# Patient Record
Sex: Female | Born: 1965
Health system: Southern US, Community
[De-identification: ages and names within clinical notes are randomized; demographics above are authoritative.]

## PROBLEM LIST (undated history)

## (undated) DIAGNOSIS — I82409 Acute embolism and thrombosis of unspecified deep veins of unspecified lower extremity: Secondary | ICD-10-CM

## (undated) HISTORY — PX: JOINT REPLACEMENT: SHX530

---

## 2000-04-04 ENCOUNTER — Inpatient Hospital Stay (HOSPITAL_COMMUNITY): Admission: AD | Admit: 2000-04-04 | Discharge: 2000-04-07 | Payer: Self-pay | Admitting: Obstetrics and Gynecology

## 2001-10-25 ENCOUNTER — Other Ambulatory Visit: Admission: RE | Admit: 2001-10-25 | Discharge: 2001-10-25 | Payer: Self-pay | Admitting: Obstetrics and Gynecology

## 2002-08-23 ENCOUNTER — Encounter: Admission: RE | Admit: 2002-08-23 | Discharge: 2002-08-23 | Payer: Self-pay | Admitting: *Deleted

## 2002-08-23 ENCOUNTER — Encounter: Payer: Self-pay | Admitting: *Deleted

## 2002-09-15 ENCOUNTER — Encounter: Admission: RE | Admit: 2002-09-15 | Discharge: 2002-09-15 | Payer: Self-pay | Admitting: Orthopedic Surgery

## 2002-09-15 ENCOUNTER — Encounter: Payer: Self-pay | Admitting: Orthopedic Surgery

## 2002-10-31 ENCOUNTER — Other Ambulatory Visit: Admission: RE | Admit: 2002-10-31 | Discharge: 2002-10-31 | Payer: Self-pay | Admitting: Obstetrics and Gynecology

## 2003-02-13 ENCOUNTER — Encounter: Admission: RE | Admit: 2003-02-13 | Discharge: 2003-02-13 | Payer: Self-pay | Admitting: *Deleted

## 2003-02-13 ENCOUNTER — Encounter: Payer: Self-pay | Admitting: *Deleted

## 2003-06-07 ENCOUNTER — Ambulatory Visit (HOSPITAL_BASED_OUTPATIENT_CLINIC_OR_DEPARTMENT_OTHER): Admission: RE | Admit: 2003-06-07 | Discharge: 2003-06-07 | Payer: Self-pay | Admitting: Orthopedic Surgery

## 2004-02-22 ENCOUNTER — Other Ambulatory Visit: Admission: RE | Admit: 2004-02-22 | Discharge: 2004-02-22 | Payer: Self-pay | Admitting: Obstetrics and Gynecology

## 2004-05-09 ENCOUNTER — Ambulatory Visit (HOSPITAL_COMMUNITY): Admission: RE | Admit: 2004-05-09 | Discharge: 2004-05-09 | Payer: Self-pay | Admitting: Orthopedic Surgery

## 2004-10-19 ENCOUNTER — Inpatient Hospital Stay (HOSPITAL_COMMUNITY): Admission: AD | Admit: 2004-10-19 | Discharge: 2004-10-19 | Payer: Self-pay | Admitting: Obstetrics and Gynecology

## 2004-10-22 ENCOUNTER — Inpatient Hospital Stay (HOSPITAL_COMMUNITY): Admission: AD | Admit: 2004-10-22 | Discharge: 2004-10-22 | Payer: Self-pay | Admitting: Obstetrics and Gynecology

## 2004-10-23 ENCOUNTER — Inpatient Hospital Stay (HOSPITAL_COMMUNITY): Admission: AD | Admit: 2004-10-23 | Discharge: 2004-10-23 | Payer: Self-pay | Admitting: Obstetrics and Gynecology

## 2004-10-25 ENCOUNTER — Inpatient Hospital Stay (HOSPITAL_COMMUNITY): Admission: AD | Admit: 2004-10-25 | Discharge: 2004-10-25 | Payer: Self-pay | Admitting: Obstetrics and Gynecology

## 2005-06-08 ENCOUNTER — Other Ambulatory Visit: Admission: RE | Admit: 2005-06-08 | Discharge: 2005-06-08 | Payer: Self-pay | Admitting: Obstetrics and Gynecology

## 2006-05-31 ENCOUNTER — Encounter: Admission: RE | Admit: 2006-05-31 | Discharge: 2006-05-31 | Payer: Self-pay | Admitting: Family Medicine

## 2006-07-05 ENCOUNTER — Other Ambulatory Visit: Admission: RE | Admit: 2006-07-05 | Discharge: 2006-07-05 | Payer: Self-pay | Admitting: Obstetrics and Gynecology

## 2007-04-21 ENCOUNTER — Encounter: Admission: RE | Admit: 2007-04-21 | Discharge: 2007-04-21 | Payer: Self-pay | Admitting: Orthopedic Surgery

## 2007-08-22 ENCOUNTER — Encounter: Admission: RE | Admit: 2007-08-22 | Discharge: 2007-08-22 | Payer: Self-pay | Admitting: Obstetrics and Gynecology

## 2008-08-22 ENCOUNTER — Encounter: Admission: RE | Admit: 2008-08-22 | Discharge: 2008-08-22 | Payer: Self-pay | Admitting: Obstetrics and Gynecology

## 2009-08-23 ENCOUNTER — Encounter: Admission: RE | Admit: 2009-08-23 | Discharge: 2009-08-23 | Payer: Self-pay | Admitting: Obstetrics and Gynecology

## 2010-08-25 ENCOUNTER — Encounter
Admission: RE | Admit: 2010-08-25 | Discharge: 2010-08-25 | Payer: Self-pay | Source: Home / Self Care | Attending: Obstetrics and Gynecology | Admitting: Obstetrics and Gynecology

## 2010-09-07 ENCOUNTER — Encounter: Payer: Self-pay | Admitting: Obstetrics and Gynecology

## 2011-01-02 NOTE — Op Note (Signed)
NAME:  Jordan Fitzgerald, Jordan Fitzgerald                    ACCOUNT NO.:  [id number]   MEDICAL RECORD NO.:  [id number]                   PATIENT TYPE:  AMB   LOCATION:  DSC                                  FACILITY:  [facility]   PHYSICIAN:  Katy Fitch. Naaman Plummer., M.D.          DATE OF BIRTH:  [birth year]   DATE OF PROCEDURE:  06/07/2003  DATE OF DISCHARGE:                                 OPERATIVE REPORT   PREOPERATIVE DIAGNOSES:  Chronic pain, left wrist, consistent with de  Quervain's stenosing tenosynovitis, and possible early scaphotrapezial  trapezoid arthritis/early ganglion formation versus synovitis.   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   PROCEDURE:  1. Exploration and release of left first dorsal compartment.  2. Exploration of capsule of the left scaphotrapezial trapezoid joint, with     identification of no significant gross pathology.   SURGEON:  Katy Fitch. Sypher, M.D.   ASSISTANT:  Marveen Reeks. Dasnoit, P.A.-C.   ANESTHESIA:  General by LMA.   ANESTHESIOLOGIST:  Bedelia Person, M.D.   INDICATIONS FOR PROCEDURE:  The patient is a 45 year old, right hand-  dominant Scientist, water quality, employed by [employer], [employer].  For the past 11  months she has experienced a shooting pain at the base of her left thumb.  She has been evaluated by Dr. Lyndee Leo. Janey Greaser and Dr. Vianne Bulls of  [facility].  Dr. Janey Greaser had initially noted a  probable first dorsal compartment stenosing tenosynovitis, and had very  appropriately injected her and placed her in a thumb Spica splint.  With  persistent pain in January 2004, he ordered an MRI of the left wrist.  This  was interpreted by Dr. Rolan Bucco L. Dover, attending radiologist, and revealed  no evidence of a fracture or avascular necrosis.  No sign of ligament  injury.  A  small amount of fluid within the scaphotrapezial articulation.  Dr. Kearney Hard felt that there may be synovitis present at the scaphotrapezial  articulation.   Due to a failure to respond to anti-inflammatory medication and prolonged  splitting, Dr. Janey Greaser referred the patient back for a radiologic-guided  injection of the left scaphotrapezial joint that was accomplished by Dr.  Dallie Piles on February 13, 2003.  The patient was not relieved of pain,  therefore after a persistent discomfort through the summer of 2004, she was  referred for an upper extremity orthopedic consultation on May 14, 2003.  A clinical examination at that time revealed clinical first dorsal  compartment stenosing tenosynovitis with a marked positive Finkelstein's  sign.  She was noted to have normal plain films, and a review of her MRI  dated January  2004, did indeed reveal fluid within the STT joint.  We  recommended proceeding with exploration and release of the first dorsal  compartment, as well as exploration of the  capsule of the ST-T joint.  I  could not palpate a ganglion or other mass; however, based on the MRI  evidence, it appeared that she may have some degree of synovitis at the  scaphotrapezial trapezoid joint.   After an informed consent, she is brought to the operating room at this  time.  During the preoperative interview, we once again confirmed that she  had a positive first dorsal compartment stenosing tenosynovitis with a  positive Finkelstein's sign, and could again not palpate a ganglion.   DESCRIPTION OF PROCEDURE:  The patient is brought to the operating room and  placed in the supine position upon the operating room table.  Following the  induction of general anesthesia by LMA, the left arm was prepped with  Betadine soap and solution, and sterilely draped.  Following exsanguination  of the left arm with an Esmarch bandage, an arterial tourniquet on the  proximal brachium was inflated  to 220 mmHg.  The procedure commenced with a  transverse incision over the distal aspect of the first dorsal compartment.  The subcutaneous tissues were carefully divided, taking care to identify the  cephalic vein and the radial sensory branches.  Blunt retractors were gently  placed, exposing the entire compartment, followed by a release of the  compartment with a scalpel and scissors.  Three tendons were present.  A  septum was not present.  There was a small extensor pollicis brevis noted  dorsally, and two slips of the abductor pollicis longus.  Upon release of  the compartment, free glide of the tendons was accomplished.  The tendons  were followed distally to the base of the thumb metacarpal, confirming the  insertions of the abductor pollicis longus tendons.  With great care, the  environs of the scaphotrapezial trapezoid joint were explored.  With gentle  blunt dissection utilizing tenotomy scissors, the radial artery was followed  from its pathway volar to the first dorsal compartment, deep to the  compartment, deep to the tendons, to the dorsal aspect of the  scaphotrapezial trapezoid joint.  Numerous branches were followed towards  the scaphoid, and no signs of a ganglion were noted.  There was no sign of a  diverticulum from the joint, nor significant synovitis extruding from the  joint.   After careful palpation of the thumb CMC joint, including stability testing  which was noted to be normal, and careful palpation of the environs of the  scaphotrapezial trapezoid joint, and examination of the scapholunate joint,  I could not identify significant pathology.  The wound was then irrigated.  The tendons were placed in the first dorsal compartment, and the skin was  repaired with intradermal #3-0 Prolene and a Steri-Strip.  A compressive  dressing was applied with sterile gauze and Ace wrap.  The patient has been advised that she can remove her dressing in three to  four days.   She may apply either a Band-Aid or simple Ace wrap and gauze  dressing.  She will begin immediate range of motion exercises.  There were  no apparent complications.                                               Katy Fitch Naaman Plummer., M.D.    RVS/MEDQ  D:  06/07/2003  T:  06/07/2003  Job:  [id number]   cc:   C.  Duane Lope, M.D.  [address]  [place]  [place] [address]  Fax: [phone]   Lyndee Leo. Janey Greaser, M.D.  [facility]   Anesthesia Department -  [facility]

## 2011-02-13 ENCOUNTER — Other Ambulatory Visit: Payer: Self-pay | Admitting: Family Medicine

## 2011-02-13 ENCOUNTER — Ambulatory Visit
Admission: RE | Admit: 2011-02-13 | Discharge: 2011-02-13 | Disposition: A | Discharge status: Home / Self Care | Payer: 59 | Source: Ambulatory Visit | Attending: Family Medicine | Admitting: Family Medicine

## 2011-02-13 DIAGNOSIS — R609 Edema, unspecified: Secondary | ICD-10-CM

## 2011-02-13 DIAGNOSIS — R52 Pain, unspecified: Secondary | ICD-10-CM

## 2011-07-03 ENCOUNTER — Other Ambulatory Visit: Payer: Self-pay | Admitting: Family Medicine

## 2011-07-03 DIAGNOSIS — I82409 Acute embolism and thrombosis of unspecified deep veins of unspecified lower extremity: Secondary | ICD-10-CM

## 2011-08-03 ENCOUNTER — Ambulatory Visit
Admission: RE | Admit: 2011-08-03 | Discharge: 2011-08-03 | Disposition: A | Discharge status: Home / Self Care | Payer: 59 | Source: Ambulatory Visit | Attending: Family Medicine | Admitting: Family Medicine

## 2011-08-03 DIAGNOSIS — I82409 Acute embolism and thrombosis of unspecified deep veins of unspecified lower extremity: Secondary | ICD-10-CM

## 2011-10-30 ENCOUNTER — Other Ambulatory Visit: Payer: Self-pay | Admitting: Obstetrics and Gynecology

## 2011-10-30 ENCOUNTER — Ambulatory Visit (HOSPITAL_COMMUNITY)
Admission: RE | Admit: 2011-10-30 | Discharge: 2011-10-30 | Disposition: A | Discharge status: Home / Self Care | Payer: 59 | Source: Ambulatory Visit | Attending: Family Medicine | Admitting: Family Medicine

## 2011-10-30 DIAGNOSIS — M7989 Other specified soft tissue disorders: Secondary | ICD-10-CM | POA: Insufficient documentation

## 2011-10-30 DIAGNOSIS — Z1231 Encounter for screening mammogram for malignant neoplasm of breast: Secondary | ICD-10-CM

## 2011-10-30 DIAGNOSIS — M79609 Pain in unspecified limb: Secondary | ICD-10-CM

## 2011-10-30 DIAGNOSIS — R52 Pain, unspecified: Secondary | ICD-10-CM

## 2011-10-30 NOTE — Progress Notes (Signed)
Right lower extremity venous duplex completed.  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

## 2011-11-24 ENCOUNTER — Ambulatory Visit
Admission: RE | Admit: 2011-11-24 | Discharge: 2011-11-24 | Disposition: A | Discharge status: Home / Self Care | Payer: 59 | Source: Ambulatory Visit | Attending: Obstetrics and Gynecology | Admitting: Obstetrics and Gynecology

## 2011-11-24 DIAGNOSIS — Z1231 Encounter for screening mammogram for malignant neoplasm of breast: Secondary | ICD-10-CM

## 2012-03-18 ENCOUNTER — Encounter: Payer: Self-pay | Admitting: Obstetrics and Gynecology

## 2012-03-18 ENCOUNTER — Ambulatory Visit (INDEPENDENT_AMBULATORY_CARE_PROVIDER_SITE_OTHER): Payer: 59 | Admitting: Obstetrics and Gynecology

## 2012-03-18 VITALS — BP 100/66 | Resp 16 | Ht 71.0 in | Wt 204.0 lb

## 2012-03-18 DIAGNOSIS — I82409 Acute embolism and thrombosis of unspecified deep veins of unspecified lower extremity: Secondary | ICD-10-CM

## 2012-03-18 DIAGNOSIS — Z124 Encounter for screening for malignant neoplasm of cervix: Secondary | ICD-10-CM

## 2012-03-18 DIAGNOSIS — G43909 Migraine, unspecified, not intractable, without status migrainosus: Secondary | ICD-10-CM

## 2012-03-18 DIAGNOSIS — Z0289 Encounter for other administrative examinations: Secondary | ICD-10-CM

## 2012-03-18 DIAGNOSIS — Z0282 Encounter for adoption services: Secondary | ICD-10-CM

## 2012-03-18 MED ORDER — SUMATRIPTAN SUCCINATE 50 MG PO TABS: 50.0000 mg | ORAL_TABLET | Freq: Once | ORAL | Status: DC | PRN

## 2012-03-18 NOTE — Progress Notes (Signed)
Regular Periods: yes Mammogram: yes  Monthly Breast Ex.: no Exercise: no  Tetanus < 10 years: yes Seatbelts: yes  NI. Bladder Functn.: no Abuse at home: no  Daily BM's: yes Stressful Work: yes  Healthy Diet: yes Sigmoid-Colonoscopy: never  Calcium: no Medical problems this year:   Request genetics blood work     LAST PAP: 11/25/10 WNL  Contraception: none   Mammogram:  2013 WNL per pt   PCP: Sigmund Hazel   PMH: 2012 DVT R leg two clogs one in calf one in leg   FMH: Adopted   Last Bone Scan: never

## 2012-03-20 DIAGNOSIS — I82409 Acute embolism and thrombosis of unspecified deep veins of unspecified lower extremity: Secondary | ICD-10-CM | POA: Insufficient documentation

## 2012-03-20 DIAGNOSIS — Z0282 Encounter for adoption services: Secondary | ICD-10-CM | POA: Insufficient documentation

## 2012-03-20 DIAGNOSIS — G43909 Migraine, unspecified, not intractable, without status migrainosus: Secondary | ICD-10-CM | POA: Insufficient documentation

## 2012-03-20 NOTE — Progress Notes (Signed)
Subjective:    Jordan Fitzgerald is a 46 y.o. female, G2P0011, who presents for an annual exam.   Patient reports hx of right leg DVT in 2012.  She is adopted and knows no family hx.  Had genetic testing for thrombophilia after dx of DVT, with negative findings.  Has migraines very sporadically (usually associated with cycle), for which she uses Imitrex--requests refill. Use cycle awareness for contraception.    History   Social History  . Marital Status: Married    Spouse Name: N/A    Number of Children: N/A  . Years of Education: N/A   Social History Main Topics  . Smoking status: Never Smoker   . Smokeless tobacco: Never Used  . Alcohol Use: No  . Drug Use: No  . Sexually Active: Yes -- Female partner(s)   Other Topics Concern  . None   Social History Narrative  . None    Menstrual cycle:   LMP: Patient's last menstrual period was 03/09/2012.           Cycle:Normal, regular  The following portions of the patient's history were reviewed and updated as appropriate: allergies, current medications, past family history, past medical history, past social history, past surgical history and problem list.  Review of Systems Pertinent items are noted in HPI. Breast:Negative for breast lump,nipple discharge or nipple retraction Gastrointestinal: Negative for abdominal pain, change in bowel habits or rectal bleeding Urinary:negative   Objective:    BP 100/66  Resp 16  Ht 5\' 11"  (1.803 m)  Wt 204 lb (92.534 kg)  BMI 28.45 kg/m2  LMP 03/09/2012    Weight:  Wt Readings from Last 1 Encounters:  03/18/12 204 lb (92.534 kg)          BMI: Body mass index is 28.45 kg/(m^2).  General Appearance: Alert, appropriate appearance for age. No acute distress HEENT: Grossly normal Neck / Thyroid: Supple, no masses, nodes or enlargement Lungs: clear to auscultation bilaterally Back: No CVA tenderness Breast Exam: No masses or nodes.No dimpling, nipple retraction or  discharge. Cardiovascular: Regular rate and rhythm. S1, S2, no murmur Gastrointestinal: Soft, non-tender, no masses or organomegaly Pelvic Exam: Vulva and vagina appear normal. Bimanual exam reveals normal uterus and adnexa. Rectovaginal: normal rectal, no masses Lymphatic Exam: Non-palpable nodes in neck, clavicular, axillary, or inguinal regions Skin: no rash or abnormalities Neurologic: Normal gait and speech, no tremor  Psychiatric: Alert and oriented, appropriate affect.   Wet Prep:not applicable Urinalysis:not applicable UPT: Not done   Assessment:    Normal gyn exam  Migraines (occasionally menstrual) Hx right leg DVT 2012   Plan:    Mammogram: 2013, normal Pap:  Done today STD screening: declined Contraception:natural family planning (NFP) Other:  NA Rx Imitrex 50 mg po prn, #30, 2 refills, for migraines sent to patient's pharmacy.      Nyra Capes, [place]

## 2012-03-23 ENCOUNTER — Telehealth: Payer: Self-pay | Admitting: Obstetrics and Gynecology

## 2012-03-23 NOTE — Telephone Encounter (Signed)
Message copied by Mason Jim on Wed Mar 23, 2012 10:28 AM ------      Message from: Cornelius Moras      Created: Fri Mar 18, 2012  4:17 PM      Regarding: BRCA questions       Patient adopted, no FHx known, has questions regarding BRCA testing.  Interested in doing and paying for (since insurance may not cover). Would you please call her next week and give her more information on the test, etc?            Thanks!      VL

## 2012-03-23 NOTE — Telephone Encounter (Signed)
TC to pt. LM to return call.  

## 2012-03-23 NOTE — Telephone Encounter (Signed)
TC from pt.  Returned call. Is interested in BRCA testing. Pt is adopted and does not know family history.  Information relayed per Alma Downs, [facility].  Out of pocket expense for BRCA $3340 plus BART which is recommended $700.  Is recommended by  ACOG so some insurances will cover cost.  Explained if testing sent to lab and would cost pt more than $375, pt would be contacted before testing was done. Pt could also pay on a monthly basis x 25 months. Also informed if test were positive, could resubmit to insurance and may then cover.  Pt has decided to contact insurance company before making decision about testing and call back with status.

## 2012-03-23 NOTE — Telephone Encounter (Signed)
Returned pt's call.  States insurance will cover genetic testing if diagnosis code is correct.  Will need to have provider call [phone] prior to testing for provider to provider call.  Pt has been scheduled 04/27/12 for testing. Note to VL.

## 2012-04-27 ENCOUNTER — Encounter: Payer: Self-pay | Admitting: Obstetrics and Gynecology

## 2012-04-27 ENCOUNTER — Ambulatory Visit (INDEPENDENT_AMBULATORY_CARE_PROVIDER_SITE_OTHER): Payer: 59 | Admitting: Obstetrics and Gynecology

## 2012-04-27 DIAGNOSIS — IMO0002 Reserved for concepts with insufficient information to code with codable children: Secondary | ICD-10-CM

## 2012-04-27 NOTE — Progress Notes (Signed)
Pt presented for genetic counseling for BRCA1 and 2. Pt is adopted and does not know family history. Pt and VL have contacted insurance company regarding coverage. Will investigate further before submitting test specimen. Pt agreeable.

## 2012-04-28 ENCOUNTER — Encounter: Payer: Self-pay | Admitting: Obstetrics and Gynecology

## 2012-04-28 NOTE — Progress Notes (Signed)
Spoke with several persons at [facility] 04/27/12 for a total of 40 min call. Unable to be given specific information regarding diagnosis code to cover genetic testing. Was informed test would need to be submitted as usual by lab and then request for testing would be reviewed.Letter of medical necessity completed by VL and sent with completed forms and specimen. Contacted [employer] for pick-up today.

## 2012-05-04 ENCOUNTER — Telehealth: Payer: Self-pay | Admitting: Obstetrics and Gynecology

## 2012-05-04 NOTE — Telephone Encounter (Signed)
TC from Lavi at [facility] lab [phone] ext [phone]. Questioning genetic component mentioned in letter of medical necessity.  Per Vl informed pt had DVT. Informed pt is requesting testing. Pt does not know family history due to adoption. States will process through insurance and pt will be contacted if cost is .$375.

## 2012-05-11 ENCOUNTER — Telehealth: Payer: Self-pay | Admitting: Obstetrics and Gynecology

## 2012-05-11 MED ORDER — VALACYCLOVIR HCL 1 G PO TABS: ORAL_TABLET | ORAL | Status: DC

## 2012-05-11 NOTE — Telephone Encounter (Signed)
Tc to pt per telephone call. Rx for Valtrex e-pres to pharm on file. Pt agrees.  

## 2012-05-11 NOTE — Telephone Encounter (Signed)
Medication refill

## 2012-05-16 ENCOUNTER — Telehealth: Payer: Self-pay | Admitting: Obstetrics and Gynecology

## 2012-05-16 NOTE — Telephone Encounter (Signed)
TC to pt. Informed that BRCA testing was not approved by insurance. Requesting appeal be implemented.  VL to be informed.

## 2012-05-24 ENCOUNTER — Ambulatory Visit: Payer: 59

## 2012-05-24 ENCOUNTER — Encounter: Payer: 59 | Admitting: Obstetrics and Gynecology

## 2012-06-28 ENCOUNTER — Ambulatory Visit
Admission: RE | Admit: 2012-06-28 | Discharge: 2012-06-28 | Disposition: A | Discharge status: Home / Self Care | Payer: 59 | Source: Ambulatory Visit | Attending: Family Medicine | Admitting: Family Medicine

## 2012-06-28 ENCOUNTER — Other Ambulatory Visit: Payer: Self-pay | Admitting: Family Medicine

## 2012-06-28 DIAGNOSIS — M79659 Pain in unspecified thigh: Secondary | ICD-10-CM

## 2012-10-31 ENCOUNTER — Emergency Department (HOSPITAL_COMMUNITY): Payer: 59

## 2012-10-31 ENCOUNTER — Emergency Department (HOSPITAL_COMMUNITY)
Admission: EM | Admit: 2012-10-31 | Discharge: 2012-10-31 | Disposition: A | Discharge status: Home / Self Care | Payer: 59 | Attending: Emergency Medicine | Admitting: Emergency Medicine

## 2012-10-31 ENCOUNTER — Encounter (HOSPITAL_COMMUNITY): Payer: Self-pay | Admitting: *Deleted

## 2012-10-31 DIAGNOSIS — Z96659 Presence of unspecified artificial knee joint: Secondary | ICD-10-CM | POA: Insufficient documentation

## 2012-10-31 DIAGNOSIS — R1031 Right lower quadrant pain: Secondary | ICD-10-CM | POA: Insufficient documentation

## 2012-10-31 DIAGNOSIS — Z79899 Other long term (current) drug therapy: Secondary | ICD-10-CM | POA: Insufficient documentation

## 2012-10-31 DIAGNOSIS — R112 Nausea with vomiting, unspecified: Secondary | ICD-10-CM | POA: Insufficient documentation

## 2012-10-31 DIAGNOSIS — Z86718 Personal history of other venous thrombosis and embolism: Secondary | ICD-10-CM | POA: Insufficient documentation

## 2012-10-31 DIAGNOSIS — R197 Diarrhea, unspecified: Secondary | ICD-10-CM | POA: Insufficient documentation

## 2012-10-31 DIAGNOSIS — Z96639 Presence of unspecified artificial wrist joint: Secondary | ICD-10-CM | POA: Insufficient documentation

## 2012-10-31 DIAGNOSIS — Z3202 Encounter for pregnancy test, result negative: Secondary | ICD-10-CM | POA: Insufficient documentation

## 2012-10-31 DIAGNOSIS — D72829 Elevated white blood cell count, unspecified: Secondary | ICD-10-CM | POA: Insufficient documentation

## 2012-10-31 DIAGNOSIS — Z96619 Presence of unspecified artificial shoulder joint: Secondary | ICD-10-CM | POA: Insufficient documentation

## 2012-10-31 HISTORY — DX: Acute embolism and thrombosis of unspecified deep veins of unspecified lower extremity: I82.409

## 2012-10-31 LAB — CBC WITH DIFFERENTIAL/PLATELET
Basophils Absolute: 0 10*3/uL (ref 0.0–0.1)
Basophils Relative: 0 % (ref 0–1)
HCT: 46.5 % — ABNORMAL HIGH (ref 36.0–46.0)
Hemoglobin: 16.6 g/dL — ABNORMAL HIGH (ref 12.0–15.0)
Lymphocytes Relative: 4 % — ABNORMAL LOW (ref 12–46)
MCHC: 35.7 g/dL (ref 30.0–36.0)
Monocytes Absolute: 0.4 10*3/uL (ref 0.1–1.0)
Neutro Abs: 15.4 10*3/uL — ABNORMAL HIGH (ref 1.7–7.7)
Platelets: 281 10*3/uL (ref 150–400)
RBC: 5.38 MIL/uL — ABNORMAL HIGH (ref 3.87–5.11)
RDW: 12.3 % (ref 11.5–15.5)
WBC: 16.5 10*3/uL — ABNORMAL HIGH (ref 4.0–10.5)

## 2012-10-31 LAB — URINE MICROSCOPIC-ADD ON

## 2012-10-31 LAB — BASIC METABOLIC PANEL
Calcium: 9.8 mg/dL (ref 8.4–10.5)
Chloride: 104 mEq/L (ref 96–112)
Creatinine, Ser: 0.86 mg/dL (ref 0.50–1.10)
GFR calc non Af Amer: 80 mL/min — ABNORMAL LOW (ref >90)

## 2012-10-31 LAB — URINALYSIS, ROUTINE W REFLEX MICROSCOPIC
Bilirubin Urine: NEGATIVE
Nitrite: NEGATIVE
Specific Gravity, Urine: 1.029 (ref 1.005–1.030)
Urobilinogen, UA: 0.2 mg/dL (ref 0.0–1.0)
pH: 5 (ref 5.0–8.0)

## 2012-10-31 LAB — POCT PREGNANCY, URINE: Preg Test, Ur: NEGATIVE

## 2012-10-31 MED ORDER — OXYCODONE-ACETAMINOPHEN 5-325 MG PO TABS: 2.0000 | ORAL_TABLET | ORAL | Status: AC | PRN

## 2012-10-31 MED ORDER — IOHEXOL 300 MG/ML  SOLN
25.0000 mL | INTRAMUSCULAR | Status: AC
  Administered 2012-10-31 (×2): 25 mL via ORAL

## 2012-10-31 MED ORDER — SODIUM CHLORIDE 0.9 % IV SOLN
INTRAVENOUS | Status: DC
Start: 2012-10-31 — End: 2012-10-31
  Administered 2012-10-31: 14:00:00 via INTRAVENOUS

## 2012-10-31 MED ORDER — IOHEXOL 300 MG/ML  SOLN
100.0000 mL | Freq: Once | INTRAMUSCULAR | Status: AC | PRN
  Administered 2012-10-31: 80 mL via INTRAVENOUS

## 2012-10-31 MED ORDER — ONDANSETRON HCL 4 MG/2ML IJ SOLN
4.0000 mg | Freq: Once | INTRAMUSCULAR | Status: AC
  Administered 2012-10-31: 4 mg via INTRAVENOUS
  Filled 2012-10-31: qty 2

## 2012-10-31 MED ORDER — SODIUM CHLORIDE 0.9 % IV BOLUS (SEPSIS)
1000.0000 mL | Freq: Once | INTRAVENOUS | Status: AC
  Administered 2012-10-31: 1000 mL via INTRAVENOUS

## 2012-10-31 MED ORDER — HYDROMORPHONE HCL PF 1 MG/ML IJ SOLN
1.0000 mg | Freq: Once | INTRAMUSCULAR | Status: AC
  Administered 2012-10-31: 1 mg via INTRAVENOUS
  Filled 2012-10-31: qty 1

## 2012-10-31 NOTE — ED Provider Notes (Signed)
History     CSN: [id number]  Arrival date & time 10/31/12  1055   First MD Initiated Contact with Patient 10/31/12 1141      Chief Complaint  Patient presents with  . R/O Appy   . Nausea    (Consider location/radiation/quality/duration/timing/severity/associated sxs/prior treatment) The history is provided by the patient and the spouse.   Patient presents with sudden onset of watery diarrhea nonbilious vomiting since yesterday. Also notes abdominal pain characterized as sharp and localized to her right lower quadrant. Denies any vaginal bleeding or discharge. Denies any urinary symptoms of dysuria or hematuria. No fever or chills. Symptoms have been constant and nothing makes them better worse. Saw her physician prior to arrival and was sent here for further evaluation after she had a leukocytosis on her CBC Past Medical History  Diagnosis Date  . DVT (deep venous thrombosis)     Past Surgical History  Procedure Laterality Date  . Joint replacement      knee, wrist, shoulder    No family history on file.  History  Substance Use Topics  . Smoking status: Never Smoker   . Smokeless tobacco: Never Used  . Alcohol Use: No    OB History   Grav Para Term Preterm Abortions TAB SAB Ect Mult Living   2    1   1  1       Review of Systems  All other systems reviewed and are negative.    Allergies  Review of patient's allergies indicates no known allergies.  Home Medications   Current Outpatient Rx  Name  Route  Sig  Dispense  Refill  . bismuth subsalicylate (KAOPECTATE) 262 MG/15ML suspension   Oral   Take 15 mLs by mouth once.         . Pediatric Multiple Vit-C-FA (CHILDRENS CHEWABLE VITAMINS PO)   Oral   Take 1 tablet by mouth daily.           BP 119/70  Pulse 110  Temp(Src) 98 F (36.7 C) (Oral)  Resp 20  SpO2 99%  Physical Exam  Nursing note and vitals reviewed. Constitutional: She is oriented to person, place, and time. She appears  well-developed and well-nourished.  Non-toxic appearance. No distress.  HENT:  Head: Normocephalic and atraumatic.  Eyes: Conjunctivae, EOM and lids are normal. Pupils are equal, round, and reactive to light.  Neck: Normal range of motion. Neck supple. No tracheal deviation present. No mass present.  Cardiovascular: Normal rate, regular rhythm and normal heart sounds.  Exam reveals no gallop.   No murmur heard. Pulmonary/Chest: Effort normal and breath sounds normal. No stridor. No respiratory distress. She has no decreased breath sounds. She has no wheezes. She has no rhonchi. She has no rales.  Abdominal: Soft. Normal appearance and bowel sounds are normal. She exhibits no distension. There is tenderness in the right lower quadrant. There is no rigidity, no rebound, no guarding and no CVA tenderness.  Musculoskeletal: Normal range of motion. She exhibits no edema and no tenderness.  Neurological: She is alert and oriented to person, place, and time. She has normal strength. No cranial nerve deficit or sensory deficit. GCS eye subscore is 4. GCS verbal subscore is 5. GCS motor subscore is 6.  Skin: Skin is warm and dry. No abrasion and no rash noted.  Psychiatric: She has a normal mood and affect. Her speech is normal and behavior is normal.    ED Course  Procedures (including critical care time)  Labs  Reviewed  CBC WITH DIFFERENTIAL  BASIC METABOLIC PANEL  URINALYSIS, ROUTINE W REFLEX MICROSCOPIC   No results found.   No diagnosis found.    MDM  His abdominal CT negative here. She has deferred her pelvic exam. She does have a mild leukocytosis. She is currently pain-free at time of discharge and will follow up as needed        Toy Baker, MD 10/31/12 1552

## 2012-10-31 NOTE — ED Notes (Signed)
Pt asked to give urine sample, pt unable to at this time but acknowledged that she would continue to try

## 2012-10-31 NOTE — ED Notes (Signed)
Pt states started having diarrhea after lunch yesterday and subsided.  Then today started having more abdominal pain to RLQ and reports nausea.  MD reported elevated WBCs and concerned for appendicitis.

## 2012-10-31 NOTE — ED Notes (Signed)
CT notified that pt is done drinking contrast

## 2013-01-13 ENCOUNTER — Other Ambulatory Visit: Payer: Self-pay

## 2013-01-13 DIAGNOSIS — Z1231 Encounter for screening mammogram for malignant neoplasm of breast: Secondary | ICD-10-CM

## 2013-02-21 ENCOUNTER — Ambulatory Visit
Admission: RE | Admit: 2013-02-21 | Discharge: 2013-02-21 | Disposition: A | Discharge status: Home / Self Care | Payer: 59 | Source: Ambulatory Visit

## 2013-02-21 DIAGNOSIS — Z1231 Encounter for screening mammogram for malignant neoplasm of breast: Secondary | ICD-10-CM

## 2014-02-05 ENCOUNTER — Other Ambulatory Visit: Payer: Self-pay

## 2014-02-05 DIAGNOSIS — Z1231 Encounter for screening mammogram for malignant neoplasm of breast: Secondary | ICD-10-CM

## 2014-02-23 ENCOUNTER — Other Ambulatory Visit (HOSPITAL_COMMUNITY): Payer: Self-pay | Admitting: Family Medicine

## 2014-02-23 ENCOUNTER — Ambulatory Visit (HOSPITAL_COMMUNITY)
Admission: RE | Admit: 2014-02-23 | Discharge: 2014-02-23 | Disposition: A | Discharge status: Home / Self Care | Payer: 59 | Source: Ambulatory Visit | Attending: Family Medicine | Admitting: Family Medicine

## 2014-02-23 DIAGNOSIS — M79661 Pain in right lower leg: Secondary | ICD-10-CM

## 2014-02-23 DIAGNOSIS — Z86718 Personal history of other venous thrombosis and embolism: Secondary | ICD-10-CM | POA: Insufficient documentation

## 2014-02-23 DIAGNOSIS — M79609 Pain in unspecified limb: Secondary | ICD-10-CM

## 2014-02-23 NOTE — Progress Notes (Signed)
Right lower extremity venous duplex completed.  Right:  No evidence of DVT, superficial thrombosis, or Baker's cyst.  Left:  Negative for DVT in the common femoral vein.  

## 2014-02-26 ENCOUNTER — Ambulatory Visit
Admission: RE | Admit: 2014-02-26 | Discharge: 2014-02-26 | Disposition: A | Discharge status: Home / Self Care | Payer: 59 | Source: Ambulatory Visit

## 2014-02-26 DIAGNOSIS — Z1231 Encounter for screening mammogram for malignant neoplasm of breast: Secondary | ICD-10-CM

## 2014-04-04 IMAGING — CT CT ABD-PELV W/ CM
2 of 6 series · 16 of 46 positions shown, 18 images · IV contrast (APPLIED)
Comparison: None.

CLINICAL DATA: Diarrhea yesterday which has subsided.  Abdominal
pain right upper quadrant with nausea.  Elevated white count.

CT ABDOMEN AND PELVIS WITH CONTRAST
TECHNIQUE: Multidetector CT imaging of the abdomen and pelvis was
performed following the standard protocol during bolus
administration of intravenous contrast.
Contrast: 80mL OMNIPAQUE IOHEXOL 300 MG/ML  SOLN

[Series 2: abd/pelv with 5.0 b31f st · axial · 0.70mm/px · z∈[-503,-63]mm · 13 of 100 slices shown, 15 images]
[im 6/100  soft-tissue]
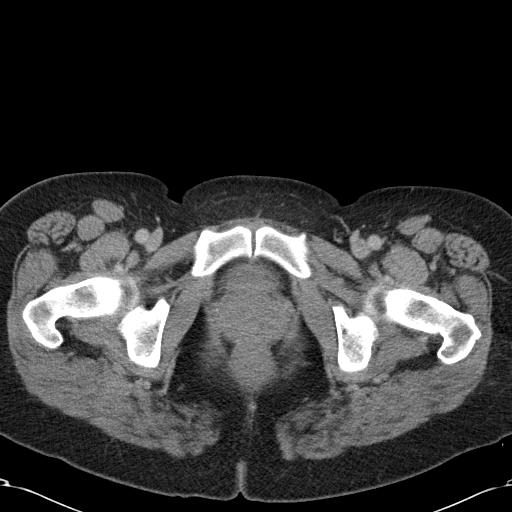
[im 6/100  bone]
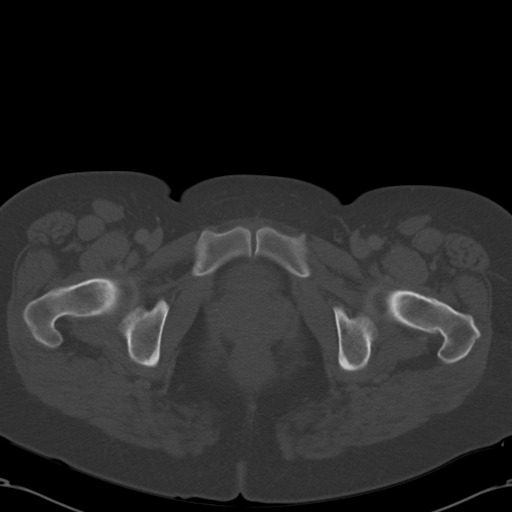
[im 16/100  soft-tissue]
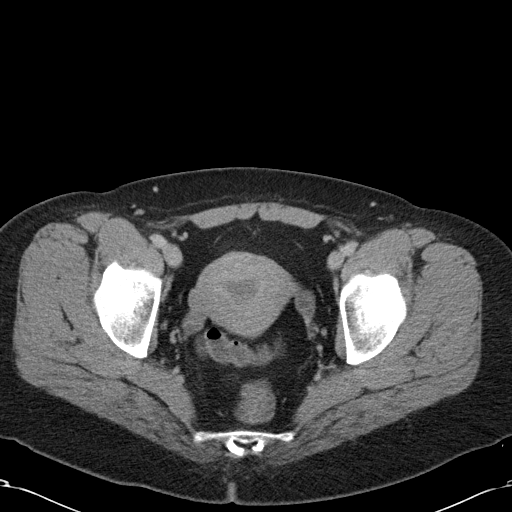
[im 21/100  soft-tissue]
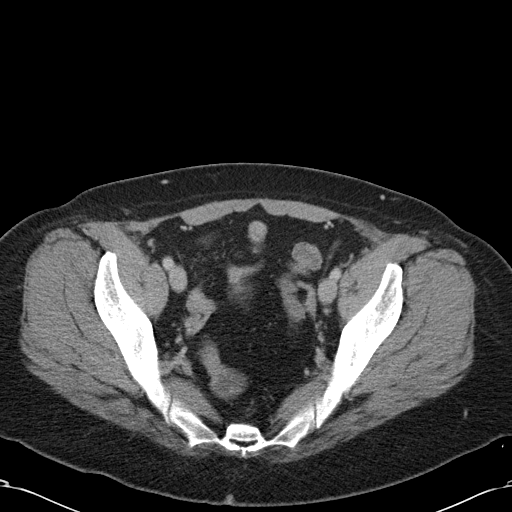
[im 27/100  soft-tissue]
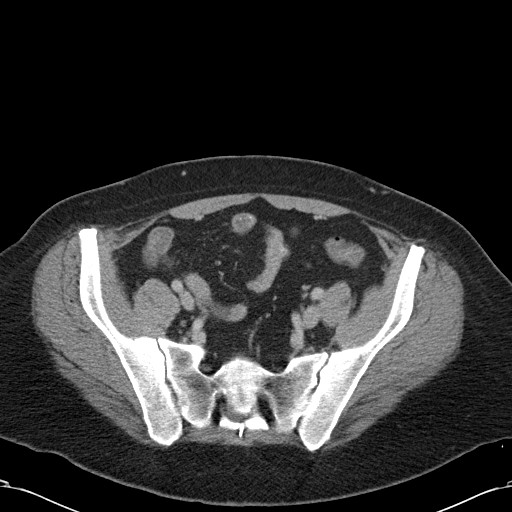
[im 37/100  soft-tissue]
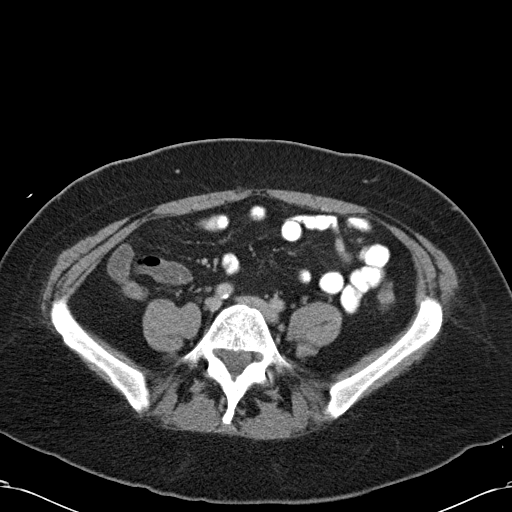
[im 42/100  soft-tissue]
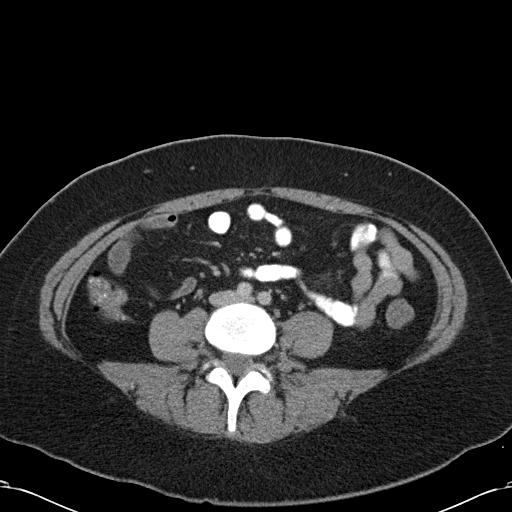
[im 53/100  soft-tissue]
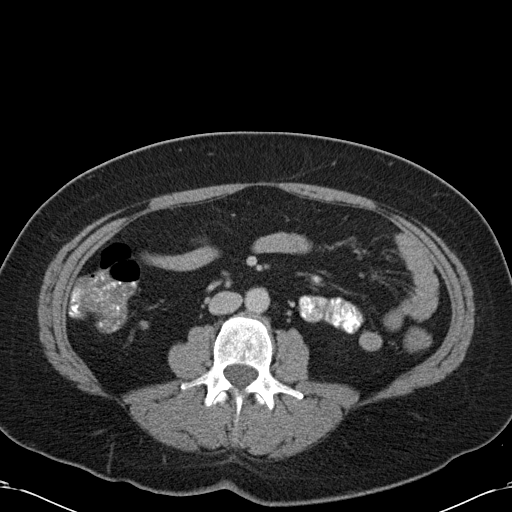
[im 58/100  soft-tissue]
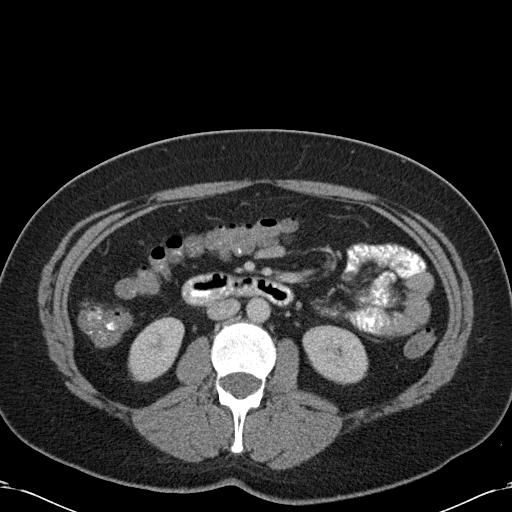
[im 63/100  soft-tissue]
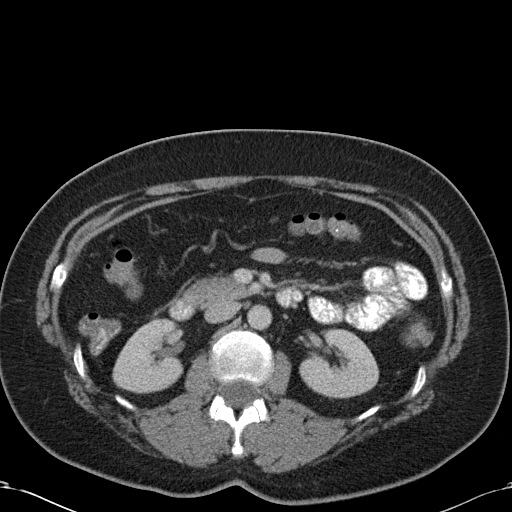
[im 63/100  bone]
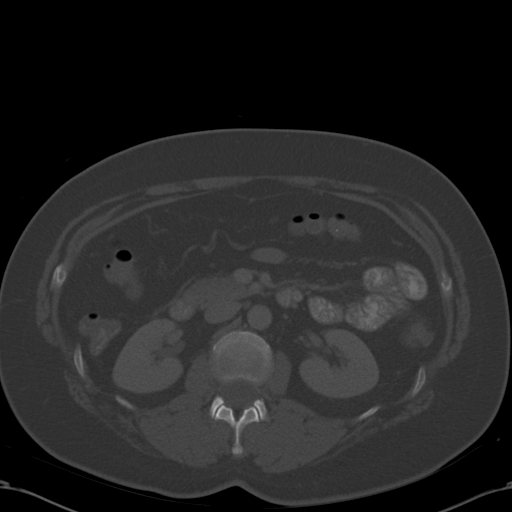
[im 73/100  soft-tissue]
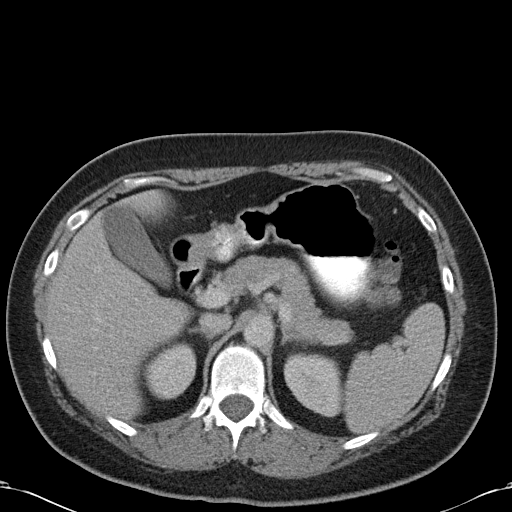
[im 79/100  soft-tissue]
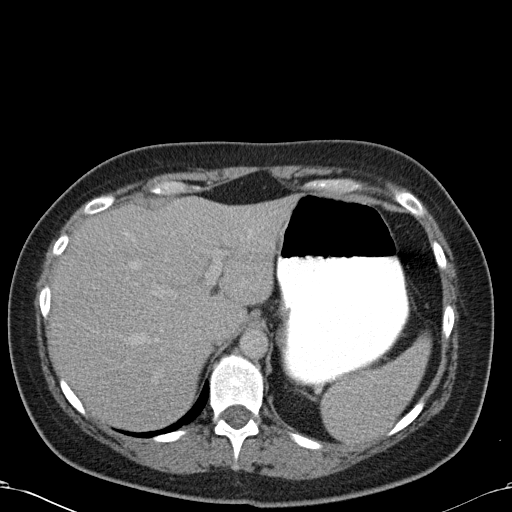
[im 84/100  soft-tissue]
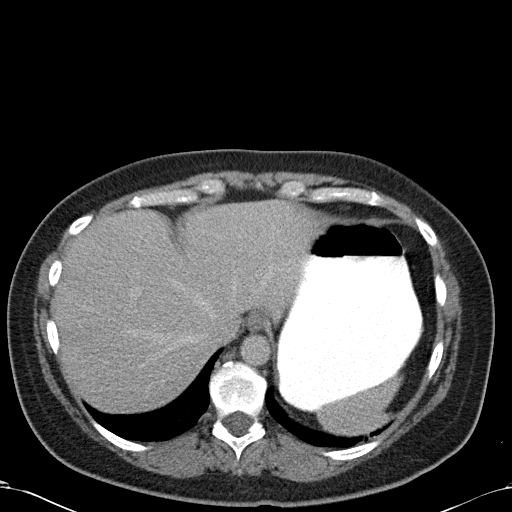
[im 94/100  soft-tissue]
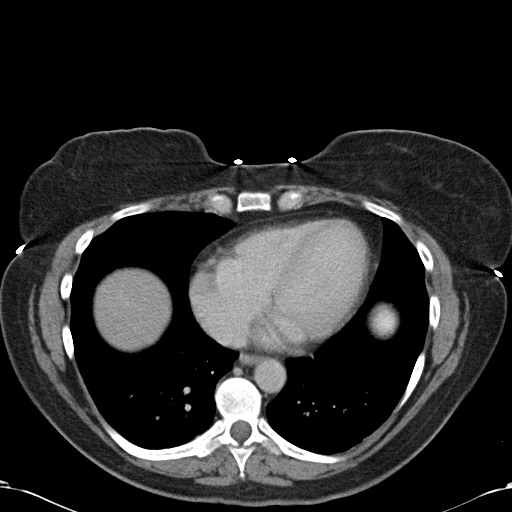

[Series 5: coronals · coronal · 0.74mm/px · 3 of 111 slices shown]
[im 37/111  soft-tissue]
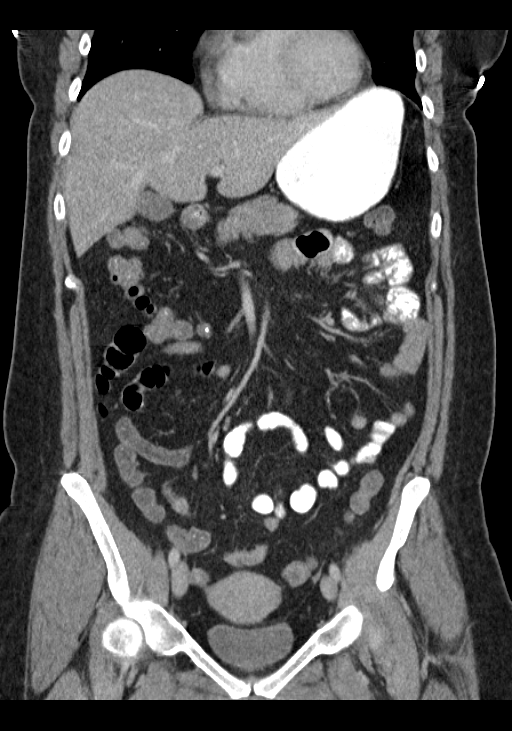
[im 49/111  soft-tissue]
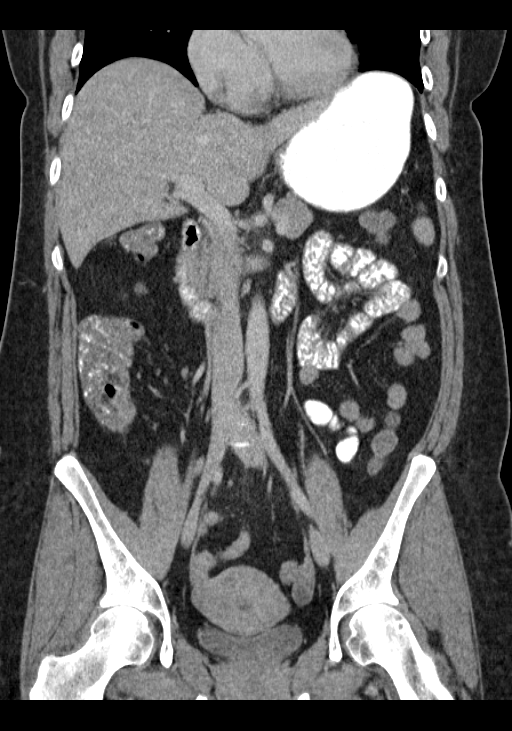
[im 62/111  soft-tissue]
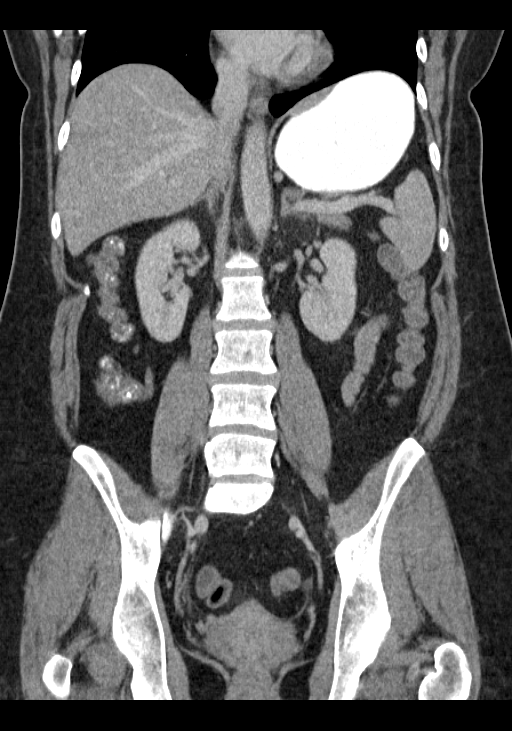

[16 of 46 positions shown; findings below may reference images not displayed]

FINDINGS: Fatty infiltration liver without focal hepatic lesion.

No calcified gallstones or pericholecystic fluid.  If primary
gallbladder abnormality is of high clinical concern then ultrasound
may be considered for further delineation.

No worrisome splenic, pancreatic, adrenal or renal lesion.

No abdominal aortic aneurysm.  Tiny calcification right common
iliac artery suggestive of advanced atherosclerotic type changes
for the patient's age.

No extraluminal bowel inflammatory process, or free air.
Specifically, no inflammation surrounds the appendix or terminal
ileum.  Fluid within the rectosigmoid region may reflect changes of
patient's history diarrhea.

Decompressed noncontrast filled urinary bladder without gross
abnormality.  Uterus and adnexa unremarkable.  No bony destructive
lesion.  No adenopathy.  Lung bases clear.
IMPRESSION: Fatty infiltration liver.

No extraluminal bowel inflammatory process, free fluid or free air
as discussed above.

## 2014-06-18 ENCOUNTER — Encounter (HOSPITAL_COMMUNITY): Payer: Self-pay | Admitting: *Deleted

## 2015-02-08 ENCOUNTER — Other Ambulatory Visit: Payer: Self-pay

## 2015-02-08 DIAGNOSIS — Z1231 Encounter for screening mammogram for malignant neoplasm of breast: Secondary | ICD-10-CM

## 2015-03-13 ENCOUNTER — Ambulatory Visit
Admission: RE | Admit: 2015-03-13 | Discharge: 2015-03-13 | Disposition: A | Discharge status: Home / Self Care | Payer: 59 | Source: Ambulatory Visit

## 2015-03-13 DIAGNOSIS — Z1231 Encounter for screening mammogram for malignant neoplasm of breast: Secondary | ICD-10-CM

## 2016-10-14 DIAGNOSIS — J189 Pneumonia, unspecified organism: Secondary | ICD-10-CM | POA: Diagnosis not present

## 2016-10-14 DIAGNOSIS — R05 Cough: Secondary | ICD-10-CM | POA: Diagnosis not present

## 2016-10-23 DIAGNOSIS — J189 Pneumonia, unspecified organism: Secondary | ICD-10-CM | POA: Diagnosis not present

## 2017-05-20 DIAGNOSIS — Z23 Encounter for immunization: Secondary | ICD-10-CM | POA: Diagnosis not present

## 2017-05-27 DIAGNOSIS — Z01419 Encounter for gynecological examination (general) (routine) without abnormal findings: Secondary | ICD-10-CM | POA: Diagnosis not present

## 2017-05-27 DIAGNOSIS — Z1231 Encounter for screening mammogram for malignant neoplasm of breast: Secondary | ICD-10-CM | POA: Diagnosis not present

## 2017-08-19 DIAGNOSIS — R5383 Other fatigue: Secondary | ICD-10-CM | POA: Diagnosis not present

## 2017-08-19 DIAGNOSIS — Z23 Encounter for immunization: Secondary | ICD-10-CM | POA: Diagnosis not present

## 2017-08-19 DIAGNOSIS — Z13220 Encounter for screening for lipoid disorders: Secondary | ICD-10-CM | POA: Diagnosis not present

## 2017-08-19 DIAGNOSIS — Z0000 Encounter for general adult medical examination without abnormal findings: Secondary | ICD-10-CM | POA: Diagnosis not present

## 2017-08-25 DIAGNOSIS — R718 Other abnormality of red blood cells: Secondary | ICD-10-CM | POA: Diagnosis not present

## 2017-09-24 DIAGNOSIS — L7211 Pilar cyst: Secondary | ICD-10-CM | POA: Diagnosis not present

## 2017-09-29 DIAGNOSIS — D2261 Melanocytic nevi of right upper limb, including shoulder: Secondary | ICD-10-CM | POA: Diagnosis not present

## 2017-09-29 DIAGNOSIS — D2262 Melanocytic nevi of left upper limb, including shoulder: Secondary | ICD-10-CM | POA: Diagnosis not present

## 2017-09-29 DIAGNOSIS — D1801 Hemangioma of skin and subcutaneous tissue: Secondary | ICD-10-CM | POA: Diagnosis not present

## 2017-10-11 DIAGNOSIS — M542 Cervicalgia: Secondary | ICD-10-CM | POA: Diagnosis not present

## 2017-10-11 DIAGNOSIS — R200 Anesthesia of skin: Secondary | ICD-10-CM | POA: Diagnosis not present

## 2017-10-15 DIAGNOSIS — M5412 Radiculopathy, cervical region: Secondary | ICD-10-CM | POA: Diagnosis not present

## 2017-10-22 DIAGNOSIS — M542 Cervicalgia: Secondary | ICD-10-CM | POA: Diagnosis not present

## 2017-10-26 DIAGNOSIS — M542 Cervicalgia: Secondary | ICD-10-CM | POA: Diagnosis not present

## 2017-10-26 DIAGNOSIS — M5412 Radiculopathy, cervical region: Secondary | ICD-10-CM | POA: Diagnosis not present

## 2017-11-05 DIAGNOSIS — D126 Benign neoplasm of colon, unspecified: Secondary | ICD-10-CM | POA: Diagnosis not present

## 2017-11-05 DIAGNOSIS — Z1211 Encounter for screening for malignant neoplasm of colon: Secondary | ICD-10-CM | POA: Diagnosis not present

## 2017-11-22 DIAGNOSIS — M5412 Radiculopathy, cervical region: Secondary | ICD-10-CM | POA: Diagnosis not present

## 2017-12-22 DIAGNOSIS — J40 Bronchitis, not specified as acute or chronic: Secondary | ICD-10-CM | POA: Diagnosis not present

## 2018-01-17 ENCOUNTER — Ambulatory Visit (HOSPITAL_BASED_OUTPATIENT_CLINIC_OR_DEPARTMENT_OTHER)
Admission: RE | Admit: 2018-01-17 | Discharge: 2018-01-17 | Disposition: A | Discharge status: Home / Self Care | Payer: 59 | Source: Ambulatory Visit | Attending: Family Medicine | Admitting: Family Medicine

## 2018-01-17 ENCOUNTER — Emergency Department (HOSPITAL_BASED_OUTPATIENT_CLINIC_OR_DEPARTMENT_OTHER)
Admission: EM | Admit: 2018-01-17 | Discharge: 2018-01-17 | Disposition: A | Discharge status: Home / Self Care | Payer: 59 | Attending: Emergency Medicine | Admitting: Emergency Medicine

## 2018-01-17 ENCOUNTER — Other Ambulatory Visit: Payer: Self-pay

## 2018-01-17 ENCOUNTER — Encounter (HOSPITAL_BASED_OUTPATIENT_CLINIC_OR_DEPARTMENT_OTHER): Payer: Self-pay | Admitting: *Deleted

## 2018-01-17 ENCOUNTER — Other Ambulatory Visit (HOSPITAL_BASED_OUTPATIENT_CLINIC_OR_DEPARTMENT_OTHER): Payer: Self-pay | Admitting: Family Medicine

## 2018-01-17 DIAGNOSIS — I82411 Acute embolism and thrombosis of right femoral vein: Secondary | ICD-10-CM

## 2018-01-17 DIAGNOSIS — R609 Edema, unspecified: Secondary | ICD-10-CM

## 2018-01-17 DIAGNOSIS — Z96619 Presence of unspecified artificial shoulder joint: Secondary | ICD-10-CM | POA: Insufficient documentation

## 2018-01-17 DIAGNOSIS — Z96659 Presence of unspecified artificial knee joint: Secondary | ICD-10-CM | POA: Insufficient documentation

## 2018-01-17 DIAGNOSIS — M79604 Pain in right leg: Secondary | ICD-10-CM

## 2018-01-17 DIAGNOSIS — I82431 Acute embolism and thrombosis of right popliteal vein: Secondary | ICD-10-CM | POA: Insufficient documentation

## 2018-01-17 DIAGNOSIS — Z86718 Personal history of other venous thrombosis and embolism: Secondary | ICD-10-CM | POA: Diagnosis not present

## 2018-01-17 DIAGNOSIS — Z96639 Presence of unspecified artificial wrist joint: Secondary | ICD-10-CM | POA: Insufficient documentation

## 2018-01-17 DIAGNOSIS — M79661 Pain in right lower leg: Secondary | ICD-10-CM | POA: Diagnosis present

## 2018-01-17 LAB — CBC WITH DIFFERENTIAL/PLATELET
Basophils Absolute: 0 10*3/uL (ref 0.0–0.1)
Basophils Relative: 0 %
EOS ABS: 0.6 10*3/uL (ref 0.0–0.7)
EOS PCT: 5 %
HCT: 41.5 % (ref 36.0–46.0)
Hemoglobin: 14.2 g/dL (ref 12.0–15.0)
LYMPHS ABS: 3 10*3/uL (ref 0.7–4.0)
Lymphocytes Relative: 25 %
MCH: 30.9 pg (ref 26.0–34.0)
MCHC: 34.2 g/dL (ref 30.0–36.0)
MCV: 90.2 fL (ref 78.0–100.0)
MONO ABS: 1 10*3/uL (ref 0.1–1.0)
Monocytes Relative: 8 %
Neutro Abs: 7.4 10*3/uL (ref 1.7–7.7)
Neutrophils Relative %: 62 %
PLATELETS: 277 10*3/uL (ref 150–400)
RBC: 4.6 MIL/uL (ref 3.87–5.11)
RDW: 12.6 % (ref 11.5–15.5)
WBC: 12 10*3/uL — ABNORMAL HIGH (ref 4.0–10.5)

## 2018-01-17 LAB — COMPREHENSIVE METABOLIC PANEL
ALT: 19 U/L (ref 14–54)
AST: 18 U/L (ref 15–41)
Albumin: 3.6 g/dL (ref 3.5–5.0)
Alkaline Phosphatase: 85 U/L (ref 38–126)
Anion gap: 7 (ref 5–15)
BILIRUBIN TOTAL: 0.8 mg/dL (ref 0.3–1.2)
BUN: 11 mg/dL (ref 6–20)
CHLORIDE: 103 mmol/L (ref 101–111)
CO2: 27 mmol/L (ref 22–32)
CREATININE: 0.85 mg/dL (ref 0.44–1.00)
Calcium: 8.7 mg/dL — ABNORMAL LOW (ref 8.9–10.3)
GFR calc Af Amer: >60 mL/min (ref >60)
GFR calc non Af Amer: >60 mL/min (ref >60)
GLUCOSE: 92 mg/dL (ref 65–99)
Potassium: 3.6 mmol/L (ref 3.5–5.1)
Sodium: 137 mmol/L (ref 135–145)
TOTAL PROTEIN: 6.9 g/dL (ref 6.5–8.1)

## 2018-01-17 MED ORDER — RIVAROXABAN (XARELTO) VTE STARTER PACK (15 & 20 MG): ORAL_TABLET | ORAL | 0 refills | Status: DC

## 2018-01-17 MED ORDER — RIVAROXABAN 15 MG PO TABS
15.0000 mg | ORAL_TABLET | Freq: Once | ORAL | Status: AC
  Administered 2018-01-17: 15 mg via ORAL
  Filled 2018-01-17: qty 1

## 2018-01-17 MED ORDER — RIVAROXABAN (XARELTO) VTE STARTER PACK (15 & 20 MG): ORAL_TABLET | ORAL | 0 refills | Status: AC

## 2018-01-17 MED ORDER — RIVAROXABAN (XARELTO) EDUCATION KIT FOR DVT/PE PATIENTS
PACK | Freq: Once | Status: AC
  Administered 2018-01-17: 22:00:00

## 2018-01-17 NOTE — ED Provider Notes (Signed)
Emergency Department Provider Note   I have reviewed the triage vital signs and the nursing notes.   HISTORY  Chief Complaint Leg Pain   HPI Jordan Fitzgerald is a 52 y.o. female with PMH of DVT in 2012 s/p 1 year of Coumadin presents to the emergency department for evaluation of right leg pain which began this weekend.  The patient has pain behind the right leg which radiates somewhat into the calf.  She had a similar pain with her prior DVT in 2012.  She completed anticoagulation course and DVT resolved.  When her pain returned she called her PCP who ordered an outpatient ultrasound.  That ultrasound was read as positive for DVT and she was referred to the emergency department.  Patient denies any chest pain or dyspnea.  She is noticed some mild right leg swelling.  Denies fevers or shaking chills.  No prior history of bleeding complications.    Past Medical History:  Diagnosis Date  . DVT (deep venous thrombosis) [facility])     Patient Active Problem List   Diagnosis Date Noted  . BRCA gene positive 04/27/2012  . DVT (deep venous thrombosis) in right leg 2012 03/20/2012  . Adopted 03/20/2012  . Migraines 03/20/2012    Past Surgical History:  Procedure Laterality Date  . JOINT REPLACEMENT     knee, wrist, shoulder    Current Outpatient Rx  . Order #: [id number] Class: Historical Med  . Order #: [id number] Class: Historical Med  . Order #: [id number] Class: Historical Med  . Order #: [id number] Class: Historical Med  . Order #: [id number] Class: Print  . Order #: [id number] Class: Historical Med  . Order #: [id number] Class: Print    Allergies Patient has no known allergies.  No family history on file.  Social History Social History   Tobacco Use  . Smoking status: Never Smoker  . Smokeless tobacco: Never Used  Substance Use Topics  . Alcohol use: No  . Drug use: No    Review of Systems  Constitutional: No fever/chills Eyes: No visual changes. ENT: No sore  throat. Cardiovascular: Denies chest pain. Respiratory: Denies shortness of breath. Gastrointestinal: No abdominal pain.  No nausea, no vomiting.  No diarrhea.  No constipation. Genitourinary: Negative for dysuria. Musculoskeletal: Negative for back pain. Positive right leg pain and swelling.  Skin: Negative for rash. Neurological: Negative for headaches, focal weakness or numbness.  10-point ROS otherwise negative.  ____________________________________________   PHYSICAL EXAM:  VITAL SIGNS: ED Triage Vitals  Enc Vitals Group     BP 01/17/18 2023 109/85     Pulse Rate 01/17/18 2023 72     Resp 01/17/18 2023 20     Temp 01/17/18 2023 98.1 F (36.7 C)     Temp Source 01/17/18 2023 Oral     SpO2 01/17/18 2023 100 %     Weight 01/17/18 2022 200 lb (90.7 kg)     Height 01/17/18 2022 6' (1.829 m)     Pain Score 01/17/18 2021 4   Constitutional: Alert and oriented. Well appearing and in no acute distress. Eyes: Conjunctivae are normal. Head: Atraumatic. Nose: No congestion/rhinnorhea. Mouth/Throat: Mucous membranes are moist.   Neck: No stridor.  Cardiovascular: Normal rate, regular rhythm. Good peripheral circulation. Grossly normal heart sounds.   Respiratory: Normal respiratory effort.  No retractions. Lungs CTAB. Gastrointestinal: No distention.  Musculoskeletal: Mild right calf swelling and edema. No discoloration. Normal neurovascular exam.  Neurologic:  Normal speech and language. No gross focal neurologic deficits are appreciated.  Skin:  Skin is warm, dry and intact. No rash noted. ____________________________________________   LABS (all labs ordered are listed, but only abnormal results are displayed)  Labs Reviewed  COMPREHENSIVE METABOLIC PANEL - Abnormal; Notable for the following components:      Result Value   Calcium 8.7 (*)    All other components within normal limits  CBC WITH DIFFERENTIAL/PLATELET - Abnormal; Notable for the following components:   WBC  12.0 (*)    All other components within normal limits   ________________________________________  RADIOLOGY  US Venous Img Lower Unilateral Right  Result Date: 01/17/2018 CLINICAL DATA:  Right thigh pain, swelling EXAM: RIGHT LOWER EXTREMITY VENOUS DOPPLER ULTRASOUND TECHNIQUE: Gray-scale sonography with graded compression, as well as color Doppler and duplex ultrasound were performed to evaluate the lower extremity deep venous systems from the level of the common femoral vein and including the common femoral, femoral, profunda femoral, popliteal and calf veins including the posterior tibial, peroneal and gastrocnemius veins when visible. The superficial great saphenous vein was also interrogated. Spectral Doppler was utilized to evaluate flow at rest and with distal augmentation maneuvers in the common femoral, femoral and popliteal veins. COMPARISON:  None. FINDINGS: Contralateral Common Femoral Vein: Respiratory phasicity is normal and symmetric with the symptomatic side. No evidence of thrombus. Normal compressibility. Common Femoral Vein: No evidence of thrombus. Normal compressibility, respiratory phasicity and response to augmentation. Saphenofemoral Junction: No evidence of thrombus. Normal compressibility and flow on color Doppler imaging. Profunda Femoral Vein: No evidence of thrombus. Normal compressibility and flow on color Doppler imaging. Femoral Vein: Occlusive thrombus noted in the right femoral vein from just below the profundus to the popliteal vein. Popliteal Vein: Occlusive thrombus in the popliteal vein. Calf Veins: No evidence of thrombus. Normal compressibility and flow on color Doppler imaging. Superficial Great Saphenous Vein: No evidence of thrombus. Normal compressibility. Venous Reflux:  None. Other Findings:  None. IMPRESSION: Deep venous thrombosis in the right femoral vein and popliteal vein. Electronically Signed   By: Rolm Baptise M.D.   On: 01/17/2018 19:42     ____________________________________________   PROCEDURES  Procedure(s) performed:   Procedures  None ____________________________________________   INITIAL IMPRESSION / ASSESSMENT AND PLAN / ED COURSE  Pertinent labs & imaging results that were available during my care of the patient were reviewed by me and considered in my medical decision making (see chart for details).  Patient presents to the emergency department for evaluation of right leg pain and swelling.  She has a DVT in the right leg found on outpatient ultrasound.  No chest pain or dyspnea to increase suspicion for pulmonary embolism.  I discussed the symptoms of strict return precautions with the patient and husband at bedside in great detail.  At this time I discussed that I would recommend starting Xarelto.  We discussed the risks and benefits of this medication.  Plan for baseline labs to assess kidney and liver function.  Patient to follow-up with primary care physician.  Given this is the second DVT in the same leg and it tracks into the thigh plan for outpatient vascular surgery follow-up as well.   I have reviewed and discussed all results (EKG, imaging, lab, urine as appropriate), exam findings with patient. I have reviewed nursing notes and appropriate previous records.  I feel the patient is safe to be discharged home without further emergent workup. Discussed usual and customary return precautions. Patient and family (if present) verbalize understanding and are comfortable with this plan.  Patient will follow-up with their primary care provider. If they do not have a primary care provider, information for follow-up has been provided to them. All questions have been answered.  ____________________________________________  FINAL CLINICAL IMPRESSION(S) / ED DIAGNOSES  Final diagnoses:  Acute deep vein thrombosis (DVT) of femoral vein of right lower extremity ([place])     MEDICATIONS GIVEN DURING THIS  VISIT:  Medications  rivaroxaban [employer] Reasons) Education Kit for DVT/PE patients ( Does not apply Given 01/17/18 2202)  Rivaroxaban (XARELTO) tablet 15 mg (15 mg Oral Given 01/17/18 2155)     NEW OUTPATIENT MEDICATIONS STARTED DURING THIS VISIT:  Discharge Medication List as of 01/17/2018  9:38 PM    START taking these medications   Details  Rivaroxaban 15 & 20 MG TBPK Take as directed on package: Start with one '15mg'$  tablet by mouth twice a day with food. On Day 22, switch to one '20mg'$  tablet once a day with food., Print        Note:  This document was prepared using Dragon voice recognition software and may include unintentional dictation errors.  Nanda Quinton, MD Emergency Medicine    Shanea Karney, [facility], MD 01/17/18 [phone]

## 2018-01-17 NOTE — ED Triage Notes (Signed)
She had an [place] tonight and was told she has a blood clot in her right thigh. Leg pain since yesterday. Hx of blood clots.

## 2018-01-17 NOTE — Discharge Instructions (Signed)
You were seen in the ED today with a DVT blood clot. I am starting you on Xarelto to treat this. Call your PCP tomorrow to discuss the diagnosis and treatment. Return to the ED with any chest pain or difficulty breathing. Do not take aspirin or any NSAIDs while on Xarelto. If you develop any bleeding you should present to the ED immediately.

## 2018-01-17 NOTE — ED Notes (Signed)
ED Provider at bedside. 

## 2018-01-19 DIAGNOSIS — I82411 Acute embolism and thrombosis of right femoral vein: Secondary | ICD-10-CM | POA: Diagnosis not present

## 2018-02-07 DIAGNOSIS — I82411 Acute embolism and thrombosis of right femoral vein: Secondary | ICD-10-CM | POA: Diagnosis not present

## 2018-02-07 DIAGNOSIS — I824Y1 Acute embolism and thrombosis of unspecified deep veins of right proximal lower extremity: Secondary | ICD-10-CM | POA: Diagnosis not present

## 2018-02-07 DIAGNOSIS — I82431 Acute embolism and thrombosis of right popliteal vein: Secondary | ICD-10-CM | POA: Diagnosis not present

## 2018-02-07 DIAGNOSIS — I82511 Chronic embolism and thrombosis of right femoral vein: Secondary | ICD-10-CM | POA: Diagnosis not present

## 2018-04-27 DIAGNOSIS — N926 Irregular menstruation, unspecified: Secondary | ICD-10-CM | POA: Diagnosis not present

## 2018-04-27 DIAGNOSIS — N920 Excessive and frequent menstruation with regular cycle: Secondary | ICD-10-CM | POA: Diagnosis not present

## 2018-05-09 DIAGNOSIS — N920 Excessive and frequent menstruation with regular cycle: Secondary | ICD-10-CM | POA: Diagnosis not present

## 2018-05-09 DIAGNOSIS — Z86718 Personal history of other venous thrombosis and embolism: Secondary | ICD-10-CM | POA: Diagnosis not present

## 2018-05-09 DIAGNOSIS — I8290 Acute embolism and thrombosis of unspecified vein: Secondary | ICD-10-CM | POA: Diagnosis not present

## 2018-05-09 DIAGNOSIS — Z7901 Long term (current) use of anticoagulants: Secondary | ICD-10-CM | POA: Diagnosis not present

## 2018-05-09 DIAGNOSIS — D649 Anemia, unspecified: Secondary | ICD-10-CM | POA: Diagnosis not present

## 2018-05-30 DIAGNOSIS — N920 Excessive and frequent menstruation with regular cycle: Secondary | ICD-10-CM | POA: Diagnosis not present

## 2018-06-17 DIAGNOSIS — N920 Excessive and frequent menstruation with regular cycle: Secondary | ICD-10-CM | POA: Diagnosis not present

## 2018-06-17 DIAGNOSIS — Z3202 Encounter for pregnancy test, result negative: Secondary | ICD-10-CM | POA: Diagnosis not present

## 2018-06-17 DIAGNOSIS — N840 Polyp of corpus uteri: Secondary | ICD-10-CM | POA: Diagnosis not present

## 2018-07-28 DIAGNOSIS — Z6828 Body mass index (BMI) 28.0-28.9, adult: Secondary | ICD-10-CM | POA: Diagnosis not present

## 2018-07-28 DIAGNOSIS — Z124 Encounter for screening for malignant neoplasm of cervix: Secondary | ICD-10-CM | POA: Diagnosis not present

## 2018-07-28 DIAGNOSIS — Z01419 Encounter for gynecological examination (general) (routine) without abnormal findings: Secondary | ICD-10-CM | POA: Diagnosis not present

## 2019-01-04 ENCOUNTER — Encounter: Payer: Self-pay | Admitting: Orthopedic Surgery

## 2019-01-04 ENCOUNTER — Ambulatory Visit: Payer: 59

## 2019-01-04 ENCOUNTER — Other Ambulatory Visit: Payer: Self-pay

## 2019-01-04 ENCOUNTER — Ambulatory Visit (INDEPENDENT_AMBULATORY_CARE_PROVIDER_SITE_OTHER): Payer: 59 | Admitting: Orthopedic Surgery

## 2019-01-04 DIAGNOSIS — M25512 Pain in left shoulder: Secondary | ICD-10-CM | POA: Diagnosis not present

## 2019-01-04 NOTE — Progress Notes (Signed)
Office Visit Note   Patient: Jordan Fitzgerald           Date of Birth: [birth year]           MRN: [id number] Visit Date: 01/04/2019 Requested by: Kathyrn Lass, [place], [place] [address] PCP: Kathyrn Lass, MD  Subjective: Chief Complaint  Patient presents with  . Left Shoulder - Pain    HPI: Jordan Fitzgerald is a 53 year old patient with left shoulder pain.  Noticed it several weeks ago and is gotten worse.  She had right shoulder labral surgery in 2006 and she did well with that.  She also has a history of DVT and is on Xarelto.  She describes popping and she cannot sleep on that left-hand side.  She works from home.  States that her symptoms feel very similar to what she had with her right shoulder years ago.  She does describe radiating pain in the biceps as well as mechanical symptoms.  She has tried occasional anti-inflammatories as well as activity modification without relief.              ROS: All systems reviewed are negative as they relate to the chief complaint within the history of present illness.  Patient denies  fevers or chills.   Assessment & Plan: Visit Diagnoses:  1. Left shoulder pain, unspecified chronicity     Plan: Impression is left shoulder pain likely SLAP tear with good rotator cuff strength.  Plan MRI arthrogram to evaluate for pathology.  I will see her back after that study.  Based on her history and exam today that she likely has similar pathology in the left shoulder that she had in the right.  I will see her back after that study  Follow-Up Instructions: Return for after MRI.   Orders:  Orders Placed This Encounter  Procedures  . XR Shoulder Left  . MR Shoulder Left w/ contrast  . Arthrogram   No orders of the defined types were placed in this encounter.     Procedures: No procedures performed   Clinical Data: No additional findings.  Objective: Vital Signs: There were no vitals taken for this visit.  Physical Exam:    Constitutional: Patient appears well-developed HEENT:  Head: Normocephalic Eyes:EOM are normal Neck: Normal range of motion Cardiovascular: Normal rate Pulmonary/chest: Effort normal Neurologic: Patient is alert Skin: Skin is warm Psychiatric: Patient has normal mood and affect    Ortho Exam: Ortho exam demonstrates full active and passive range of motion of the left shoulder.  She does have positive O'Brien's testing positive speeds testing.  No discrete AC joint tenderness on that left.  She has excellent rotator cuff strength isolated infraspinatus supraspinatus and subscap muscle testing.  Specialty Comments:  No specialty comments available.  Imaging: Xr Shoulder Left  Result Date: 01/04/2019 AP axillary outlet left shoulder reviewed.  Mild AC joint degenerative changes noted.  Shoulder is located.  No acute fracture or dislocation.  Visualized lung fields clear.    PMFS History: Patient Active Problem List   Diagnosis Date Noted  . BRCA gene positive 04/27/2012  . DVT (deep venous thrombosis) in right leg 2012 03/20/2012  . Adopted 03/20/2012  . Migraines 03/20/2012   Past Medical History:  Diagnosis Date  . DVT (deep venous thrombosis) ([place])     History reviewed. No pertinent family history.  Past Surgical History:  Procedure Laterality Date  . JOINT REPLACEMENT     knee, wrist, shoulder   Social  History   Occupational History  . Not on file  Tobacco Use  . Smoking status: Never Smoker  . Smokeless tobacco: Never Used  Substance and Sexual Activity  . Alcohol use: No  . Drug use: No  . Sexual activity: Yes    Partners: Male

## 2019-01-14 ENCOUNTER — Telehealth: Payer: Self-pay

## 2019-01-14 ENCOUNTER — Other Ambulatory Visit: Payer: 59

## 2019-01-14 DIAGNOSIS — Z20822 Contact with and (suspected) exposure to covid-19: Secondary | ICD-10-CM

## 2019-01-14 NOTE — Addendum Note (Signed)
Addended by: Corky Sox E on: 01/14/2019 12:53 PM   Modules accepted: Orders

## 2019-01-14 NOTE — Telephone Encounter (Signed)
Attempted to call pt. To discuss recent visit at [facility].  Left message to return call to [phone], to speak to a nurse.

## 2019-01-16 LAB — NOVEL CORONAVIRUS, NAA: SARS-CoV-2, NAA: NOT DETECTED

## 2019-01-18 ENCOUNTER — Ambulatory Visit: Payer: 59 | Admitting: Orthopedic Surgery

## 2019-02-08 ENCOUNTER — Ambulatory Visit
Admission: RE | Admit: 2019-02-08 | Discharge: 2019-02-08 | Disposition: A | Discharge status: Home / Self Care | Payer: 59 | Source: Ambulatory Visit | Attending: Orthopedic Surgery | Admitting: Orthopedic Surgery

## 2019-02-08 ENCOUNTER — Other Ambulatory Visit: Payer: Self-pay

## 2019-02-08 DIAGNOSIS — M25512 Pain in left shoulder: Secondary | ICD-10-CM

## 2019-02-08 MED ORDER — IOPAMIDOL (ISOVUE-M 200) INJECTION 41%
13.0000 mL | Freq: Once | INTRAMUSCULAR | Status: AC
  Administered 2019-02-08: 13 mL via INTRA_ARTICULAR

## 2019-02-10 ENCOUNTER — Encounter: Payer: Self-pay | Admitting: Orthopedic Surgery

## 2019-02-10 ENCOUNTER — Ambulatory Visit (INDEPENDENT_AMBULATORY_CARE_PROVIDER_SITE_OTHER): Payer: 59 | Admitting: Orthopedic Surgery

## 2019-02-10 ENCOUNTER — Other Ambulatory Visit: Payer: Self-pay

## 2019-02-10 DIAGNOSIS — S43432A Superior glenoid labrum lesion of left shoulder, initial encounter: Secondary | ICD-10-CM

## 2019-02-10 NOTE — Progress Notes (Signed)
Office Visit Note   Patient: Jordan Fitzgerald           Date of Birth: 08-14-1966           MRN: [id number] Visit Date: 02/10/2019 Requested by: Jordan Fitzgerald, [place],  [place] [address] PCP: Jordan Lass, MD  Subjective: Chief Complaint  Patient presents with  . Follow-up    HPI: Jordan Fitzgerald is a patient with left shoulder pain.  Since I have last seen her she has had an MRI scan which does show significant irregularity of the superior anterior labrum.  This is exactly the same scenario she had in her right shoulder and she did very well with arthroscopy and biceps tenodesis on that side.  She reports pain on the inside of her shoulder radiating into the upper arm.  She is on Xarelto for DVT.  That would have to be managed in the perioperative time..  Currently she works from home.  She actually had to stop driving because of that shoulder pain.  She denies any neck pain.              ROS: All systems reviewed are negative as they relate to the chief complaint within the history of present illness.  Patient denies  fevers or chills.   Assessment & Plan: Visit Diagnoses:  1. Superior labrum anterior-to-posterior (SLAP) tear of left shoulder     Plan: Impression is left shoulder SLAP tear symptomatic in refractory to conservative measures.  Based on her excellent result with her right shoulder surgery with essentially the same scan findings we discussed alternatives today.  One would be injection.  The other would be surgical treatment with biceps tendon release tenodesis and debridement.  I think the patient would like to go ahead and proceed with surgical intervention which I think is the best option for her based on the history with her right shoulder.  The risk and benefits of that are discussed including but not limited to infection nerve vessel damage shoulder stiffness as well as potential DVT problems.  All questions answered.  Follow-Up Instructions: No follow-ups on  file.   Orders:  No orders of the defined types were placed in this encounter.  No orders of the defined types were placed in this encounter.     Procedures: No procedures performed   Clinical Data: No additional findings.  Objective: Vital Signs: There were no vitals taken for this visit.  Physical Exam:   Constitutional: Patient appears well-developed HEENT:  Head: Normocephalic Eyes:EOM are normal Neck: Normal range of motion Cardiovascular: Normal rate Pulmonary/chest: Effort normal Neurologic: Patient is alert Skin: Skin is warm Psychiatric: Patient has normal mood and affect    Ortho Exam: Ortho exam demonstrates positive O'Brien's testing on the left negative on the right.  She has pain with overhead range of motion but no AC joint tenderness.  Negative apprehension relocation testing.  Motor or sensory function of the hand is intact.  No masses lymphadenopathy or skin changes noted in that left shoulder girdle region.  Specialty Comments:  No specialty comments available.  Imaging: No results found.   PMFS History: Patient Active Problem List   Diagnosis Date Noted  . DVT (deep venous thrombosis) in right leg 2012 03/20/2012  . Adopted 03/20/2012  . Migraines 03/20/2012   Past Medical History:  Diagnosis Date  . DVT (deep venous thrombosis) ([place])     History reviewed. No pertinent family history.  Past Surgical History:  Procedure Laterality Date  . JOINT REPLACEMENT     knee, wrist, shoulder   Social History   Occupational History  . Not on file  Tobacco Use  . Smoking status: Never Smoker  . Smokeless tobacco: Never Used  Substance and Sexual Activity  . Alcohol use: No  . Drug use: No  . Sexual activity: Yes    Partners: Male

## 2019-02-11 ENCOUNTER — Encounter: Payer: Self-pay | Admitting: Orthopedic Surgery

## 2019-02-13 ENCOUNTER — Other Ambulatory Visit: Payer: Self-pay | Admitting: Orthopedic Surgery

## 2019-02-13 MED ORDER — TRAMADOL HCL 50 MG PO TABS: 50.0000 mg | ORAL_TABLET | Freq: Two times a day (BID) | ORAL | 0 refills | Status: AC | PRN

## 2019-02-13 MED ORDER — METHOCARBAMOL 500 MG PO TABS: 500.0000 mg | ORAL_TABLET | Freq: Three times a day (TID) | ORAL | 0 refills | Status: AC | PRN

## 2019-02-13 NOTE — Telephone Encounter (Signed)
Keep shoulder moving.  Very important.  I would try muscle relaxers perhaps Robaxin 500 mg p.o. 3 times daily #30.  Could also try tramadol 1 p.o. every 12 hours as needed pain #30 but definitely keep the shoulder moving.  I think Jordan Fitzgerald likely has more bleeding than usual because Jordan Fitzgerald is on blood thinners.

## 2019-02-15 ENCOUNTER — Other Ambulatory Visit: Payer: Self-pay

## 2019-02-23 ENCOUNTER — Telehealth: Payer: Self-pay | Admitting: Orthopedic Surgery

## 2019-02-23 ENCOUNTER — Encounter: Payer: Self-pay | Admitting: Orthopedic Surgery

## 2019-02-23 NOTE — Telephone Encounter (Signed)
Can you contact him - Jordan Fitzgerald  to get a plan thx

## 2019-02-23 NOTE — Telephone Encounter (Signed)
Noted. Thanks.  Jordan Fitzgerald has called and LM for patient advising.

## 2019-02-23 NOTE — Telephone Encounter (Signed)
I spoke with  Rosemarie Ax at [facility].  She states they did receive the form requesting perioperative instructions for patient's Xarelto sent on 02-11-19.  However it has not been completed. Dr. Joan Flores is not in the office today, but Rosemarie Ax will fax instructions to our office and also contact patient with instructions when Dr. Joan Flores has completed the form. She will let Dr. Joan Flores know the surgery is scheduled for March 06, 2019.

## 2019-02-27 ENCOUNTER — Encounter: Payer: Self-pay | Admitting: Orthopedic Surgery

## 2019-02-28 ENCOUNTER — Telehealth: Payer: Self-pay | Admitting: Orthopedic Surgery

## 2019-02-28 NOTE — Telephone Encounter (Signed)
I called Dr. Lorita Officer office again this morning and spoke with [facility].  She stated that she would hang up with me and page Dr. Joan Flores immediately.  As of today, their office has had 2 weeks notice with the request of providing instructions for the Xarelto. Rosemarie Ax mentioned that the doctor is doing virtual visits and is not physically in the office.  She agreed to get back with our office TODAY as well as reaching out to the patient.

## 2019-02-28 NOTE — Telephone Encounter (Signed)
Noted. Thanks.

## 2019-03-06 ENCOUNTER — Encounter: Payer: Self-pay | Admitting: Orthopedic Surgery

## 2019-03-06 DIAGNOSIS — M19011 Primary osteoarthritis, right shoulder: Secondary | ICD-10-CM | POA: Diagnosis not present

## 2019-03-06 DIAGNOSIS — S43432A Superior glenoid labrum lesion of left shoulder, initial encounter: Secondary | ICD-10-CM | POA: Diagnosis not present

## 2019-03-15 ENCOUNTER — Ambulatory Visit (INDEPENDENT_AMBULATORY_CARE_PROVIDER_SITE_OTHER): Payer: 59 | Admitting: Orthopedic Surgery

## 2019-03-15 ENCOUNTER — Encounter: Payer: Self-pay | Admitting: Orthopedic Surgery

## 2019-03-15 ENCOUNTER — Other Ambulatory Visit: Payer: Self-pay

## 2019-03-15 DIAGNOSIS — S43432A Superior glenoid labrum lesion of left shoulder, initial encounter: Secondary | ICD-10-CM

## 2019-03-15 NOTE — Progress Notes (Signed)
   Post-Op Visit Note   Patient: Jordan Fitzgerald           Date of Birth: Jun 30, 1966           MRN: [id number] Visit Date: 03/15/2019 PCP: Kathyrn Lass, MD   Assessment & Plan:  Chief Complaint:  Chief Complaint  Patient presents with  . Left Shoulder - Routine Post Op   Visit Diagnoses:  1. Superior labrum anterior-to-posterior (SLAP) tear of left shoulder     Plan: [name] is a patient with left shoulder pain.  9 days out left shoulder arthroscopy with biceps tenodesis for SLAP tear.  On exam she has good range of motion.  Biceps tendon is stable and intact.  I like her to go to therapy 1 time a week for 4 weeks just for passive range of motion with no active biceps.  Discontinue sling at this time.  Back on Xarelto.  Off pain medicine.  Follow-up in 4 weeks  Follow-Up Instructions: Return in about 4 weeks (around 04/12/2019).   Orders:  No orders of the defined types were placed in this encounter.  No orders of the defined types were placed in this encounter.   Imaging: No results found.  PMFS History: Patient Active Problem List   Diagnosis Date Noted  . DVT (deep venous thrombosis) in right leg 2012 03/20/2012  . Adopted 03/20/2012  . Migraines 03/20/2012   Past Medical History:  Diagnosis Date  . DVT (deep venous thrombosis) ([place])     History reviewed. No pertinent family history.  Past Surgical History:  Procedure Laterality Date  . JOINT REPLACEMENT     knee, wrist, shoulder   Social History   Occupational History  . Not on file  Tobacco Use  . Smoking status: Never Smoker  . Smokeless tobacco: Never Used  Substance and Sexual Activity  . Alcohol use: No  . Drug use: No  . Sexual activity: Yes    Partners: Male

## 2019-03-17 ENCOUNTER — Inpatient Hospital Stay: Payer: 59 | Admitting: Orthopedic Surgery

## 2019-04-12 ENCOUNTER — Ambulatory Visit (INDEPENDENT_AMBULATORY_CARE_PROVIDER_SITE_OTHER): Payer: 59 | Admitting: Orthopedic Surgery

## 2019-04-12 ENCOUNTER — Encounter: Payer: Self-pay | Admitting: Orthopedic Surgery

## 2019-04-12 DIAGNOSIS — S43432A Superior glenoid labrum lesion of left shoulder, initial encounter: Secondary | ICD-10-CM

## 2019-04-12 NOTE — Progress Notes (Signed)
   Post-Op Visit Note   Patient: Jordan Fitzgerald           Date of Birth: 09/27/1965           MRN: [id number] Visit Date: 04/12/2019 PCP: Kathyrn Lass, MD   Assessment & Plan:  Chief Complaint:  Chief Complaint  Patient presents with  . Left Shoulder - Routine Post Op   Visit Diagnoses: No diagnosis found.  Plan: Patient is a 53 year old female who presents s/p left shoulder scope with biceps tenodesis and subacromial decompression.  Patient is 5 weeks out from her surgery.  She is doing well and not taking any medications.  She is doing physical therapy daily as part of home exercise program as well as going into outpatient therapy every couple weeks for progress checks.  She is going to [facility] physical therapy.  Her motion is good with flexion to 90 degrees and above, abduction to 80 degrees, external rotation to 40 degrees.  Her incisions of healed very well.  She has stretching her shoulders by doing Thera-Band exercises as well as leaning against a table with her torso in order to stretch the shoulder.  On exam she has some pain with supination that is likely from her biceps tenodesis not fully healing into the bone just yet but this should improve with time.  I think she is doing very well she will follow-up with the office as needed.  Follow-Up Instructions: No follow-ups on file.   Orders:  No orders of the defined types were placed in this encounter.  No orders of the defined types were placed in this encounter.   Imaging: No results found.  PMFS History: Patient Active Problem List   Diagnosis Date Noted  . DVT (deep venous thrombosis) in right leg 2012 03/20/2012  . Adopted 03/20/2012  . Migraines 03/20/2012   Past Medical History:  Diagnosis Date  . DVT (deep venous thrombosis) ([place])     History reviewed. No pertinent family history.  Past Surgical History:  Procedure Laterality Date  . JOINT REPLACEMENT     knee, wrist, shoulder   Social History    Occupational History  . Not on file  Tobacco Use  . Smoking status: Never Smoker  . Smokeless tobacco: Never Used  Substance and Sexual Activity  . Alcohol use: No  . Drug use: No  . Sexual activity: Yes    Partners: Male

## 2019-04-14 ENCOUNTER — Encounter: Payer: Self-pay | Admitting: Orthopedic Surgery
